# Patient Record
Sex: Male | Born: 1975 | Race: White | Hispanic: No | Marital: Single
Health system: Southern US, Community
[De-identification: ages and names within clinical notes are randomized; demographics above are authoritative.]

---

## 2016-10-29 ENCOUNTER — Emergency Department (HOSPITAL_BASED_OUTPATIENT_CLINIC_OR_DEPARTMENT_OTHER)
Admission: EM | Admit: 2016-10-29 | Discharge: 2016-10-29 | Disposition: A | Discharge status: Home / Self Care | Payer: BLUE CROSS/BLUE SHIELD | Attending: Emergency Medicine | Admitting: Emergency Medicine

## 2016-10-29 ENCOUNTER — Emergency Department (HOSPITAL_BASED_OUTPATIENT_CLINIC_OR_DEPARTMENT_OTHER): Payer: BLUE CROSS/BLUE SHIELD

## 2016-10-29 DIAGNOSIS — K5900 Constipation, unspecified: Secondary | ICD-10-CM | POA: Diagnosis not present

## 2016-10-29 DIAGNOSIS — R1011 Right upper quadrant pain: Secondary | ICD-10-CM | POA: Diagnosis present

## 2016-10-29 DIAGNOSIS — R109 Unspecified abdominal pain: Secondary | ICD-10-CM | POA: Diagnosis not present

## 2016-10-29 LAB — COMPREHENSIVE METABOLIC PANEL
ALBUMIN: 3.8 g/dL (ref 3.5–5.0)
ALK PHOS: 51 U/L (ref 38–126)
ALT: 52 U/L (ref 17–63)
ANION GAP: 7 (ref 5–15)
AST: 27 U/L (ref 15–41)
BILIRUBIN TOTAL: 0.4 mg/dL (ref 0.3–1.2)
BUN: 12 mg/dL (ref 6–20)
CALCIUM: 8.5 mg/dL — AB (ref 8.9–10.3)
CO2: 26 mmol/L (ref 22–32)
Chloride: 105 mmol/L (ref 101–111)
Creatinine, Ser: 1.09 mg/dL (ref 0.61–1.24)
GFR calc Af Amer: >60 mL/min (ref >60)
GLUCOSE: 97 mg/dL (ref 65–99)
Potassium: 4.4 mmol/L (ref 3.5–5.1)
Sodium: 138 mmol/L (ref 135–145)
TOTAL PROTEIN: 7.5 g/dL (ref 6.5–8.1)

## 2016-10-29 LAB — CBC
HEMATOCRIT: 40 % (ref 39.0–52.0)
HEMOGLOBIN: 14.3 g/dL (ref 13.0–17.0)
MCH: 30.3 pg (ref 26.0–34.0)
MCHC: 35.8 g/dL (ref 30.0–36.0)
MCV: 84.7 fL (ref 78.0–100.0)
Platelets: 216 10*3/uL (ref 150–400)
RBC: 4.72 MIL/uL (ref 4.22–5.81)
RDW: 12.6 % (ref 11.5–15.5)
WBC: 7.4 10*3/uL (ref 4.0–10.5)

## 2016-10-29 LAB — URINALYSIS, ROUTINE W REFLEX MICROSCOPIC
GLUCOSE, UA: NEGATIVE mg/dL
Hgb urine dipstick: NEGATIVE
KETONES UR: 15 mg/dL — AB
Leukocytes, UA: NEGATIVE
NITRITE: NEGATIVE
PH: 5.5 (ref 5.0–8.0)
Protein, ur: NEGATIVE mg/dL
Specific Gravity, Urine: 1.028 (ref 1.005–1.030)

## 2016-10-29 LAB — LIPASE, BLOOD: Lipase: 29 U/L (ref 11–51)

## 2016-10-29 MED ORDER — POLYETHYLENE GLYCOL 3350 17 G PO PACK: 17.0000 g | PACK | Freq: Every day | ORAL | 0 refills | Status: AC

## 2016-10-29 MED ORDER — SODIUM CHLORIDE 0.9 % IV BOLUS (SEPSIS)
1000.0000 mL | Freq: Once | INTRAVENOUS | Status: AC
Start: 2016-10-29 — End: 2016-10-29
  Administered 2016-10-29: 1000 mL via INTRAVENOUS

## 2016-10-29 MED ORDER — SODIUM CHLORIDE 0.9 % IV BOLUS (SEPSIS): 1000.0000 mL | Freq: Once | INTRAVENOUS | Status: DC

## 2016-10-29 NOTE — Discharge Instructions (Signed)
Return to the ED with any concerns including vomiting and not able to keep down liquids or your medications, abdominal pain especially if it localizes to the right lower abdomen, fever or chills, and decreased urine output, decreased level of alertness or lethargy, or any other alarming symptoms.  °

## 2016-10-29 NOTE — ED Notes (Signed)
ED Provider at bedside. 

## 2016-10-29 NOTE — ED Notes (Signed)
Patient transported to X-ray 

## 2016-10-29 NOTE — ED Provider Notes (Signed)
[facility]-EMERGENCY DEPT Provider Note   CSN: [id number] Arrival date & time: 10/29/16  1149     History   Chief Complaint Chief Complaint  Patient presents with  . Abdominal Pain    HPI Maurice Alvarez is a 41 y.o. male.  HPI  Pt presenting with c/o abdominal pain.  Today he felt a sharp pain in his upper abdomen that radiated throughout his abdomen and to the left side.  He had recently been having diarrhea over the past several days that was improving- now he is having soft stools- 2 stools today.  No vomiting or fevers.  Had not had any abdominal pain until sudden onset of sharp pain today.  He has not had any treatment prior to arrival.  There are no other associated systemic symptoms, there are no other alleviating or modifying factors.   No past medical history on file.  There are no active problems to display for this patient.   No past surgical history on file.     Home Medications    Prior to Admission medications   Medication Sig Start Date End Date Taking? Authorizing Provider  polyethylene glycol (MIRALAX) packet Take 17 g by mouth daily. 10/29/16   Jerelyn Scott, MD    Family History No family history on file.  Social History Social History  Substance Use Topics  . Smoking status: Not on file  . Smokeless tobacco: Not on file  . Alcohol use Not on file     Allergies   Patient has no allergy information on record.   Review of Systems Review of Systems  ROS reviewed and all otherwise negative except for mentioned in HPI   Physical Exam Updated Vital Signs BP (!) 133/99   Pulse 72   Temp 97.8 F (36.6 C) (Oral)   Resp 18   Ht 5\' 7"  (1.702 m)   Wt 108.9 kg (240 lb)   SpO2 99%   BMI 37.59 kg/m  Vitals reviewed Physical Exam Physical Examination: General appearance - alert, well appearing, and in no distress Mental status - alert, oriented to person, place, and time Eyes - no conjunctival injection no scleral icterus Mouth - mucous  membranes moist, pharynx normal without lesions Neck - supple, no significant adenopathy Chest - clear to auscultation, no wheezes, rales or rhonchi, symmetric air entry Heart - normal rate, regular rhythm, normal S1, S2, no murmurs, rubs, clicks or gallops Abdomen - soft, epigastric and left sided abdominal tenderness-- no gaurding or reboudn tendenress, nondistended, no masses or organomegaly Neurological - alert, oriented, normal speech, no focal findings or movement disorder noted Extremities - peripheral pulses normal, no pedal edema, no clubbing or cyanosis Skin - normal coloration and turgor, no rashes  ED Treatments / Results  Labs (all labs ordered are listed, but only abnormal results are displayed) Labs Reviewed  COMPREHENSIVE METABOLIC PANEL - Abnormal; Notable for the following:       Result Value   Calcium 8.5 (*)    All other components within normal limits  URINALYSIS, ROUTINE W REFLEX MICROSCOPIC - Abnormal; Notable for the following:    Color, Urine AMBER (*)    Bilirubin Urine SMALL (*)    Ketones, ur 15 (*)    All other components within normal limits  CBC  LIPASE, BLOOD    EKG  EKG Interpretation None       Radiology No results found.  Procedures Procedures (including critical care time)  Medications Ordered in ED Medications  sodium chloride 0.9 %  bolus 1,000 mL (0 mLs Intravenous Stopped 10/29/16 1532)     Initial Impression / Assessment and Plan / ED Course  I have reviewed the triage vital signs and the nursing notes.  Pertinent labs & imaging results that were available during my care of the patient were reviewed by me and considered in my medical decision making (see chart for details).     Pt presenting with c/o upper abdominal pain, going to left side.  Labs reassuring, xray reassuring and does show some constipation- no signs of obstruction.  Pt started on miralax.   Discharged with strict return precautions.  Pt agreeable with  plan.  Final Clinical Impressions(s) / ED Diagnoses   Final diagnoses:  Abdominal pain, unspecified abdominal location  Constipation, unspecified constipation type    New Prescriptions Discharge Medication List as of 10/29/2016  3:16 PM    START taking these medications   Details  polyethylene glycol (MIRALAX) packet Take 17 g by mouth daily., Starting Thu 10/29/2016, Print         Jerelyn ScottLinker, Burhan Barham, MD 11/03/16 [phone]

## 2016-10-29 NOTE — ED Triage Notes (Signed)
Painless diarrhea for a week. Today his stool is formed and he is now having abdominal pain. Hx of celiac disease.

## 2019-03-07 IMAGING — DX DG ABDOMEN ACUTE W/ 1V CHEST
4 series · 4 of 4 positions shown · non-contrast
Comparison: None.

CLINICAL DATA: 6 day history of diarrhea and generalized abdominal
pain which began today.

EXAM:
DG ABDOMEN ACUTE W/ 1V CHEST

[chest pa]
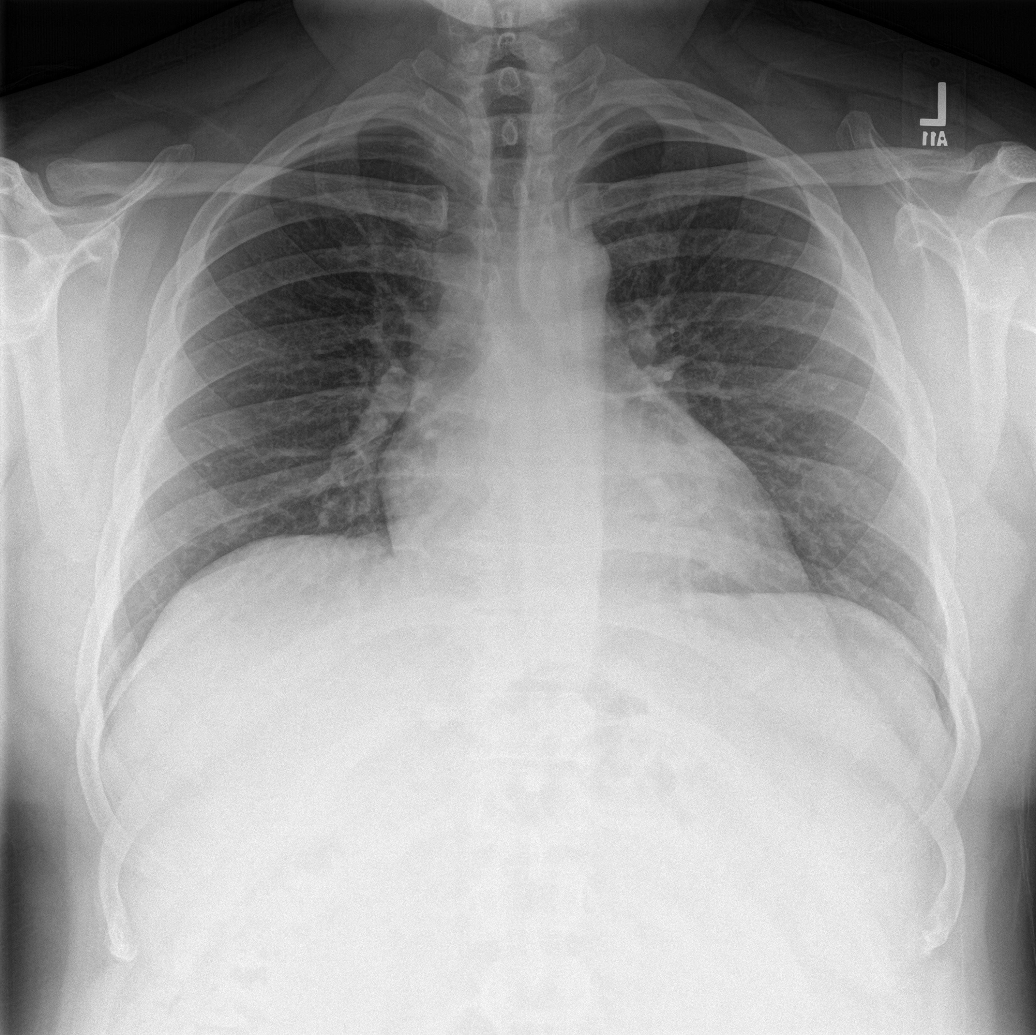

[abdomen erect]
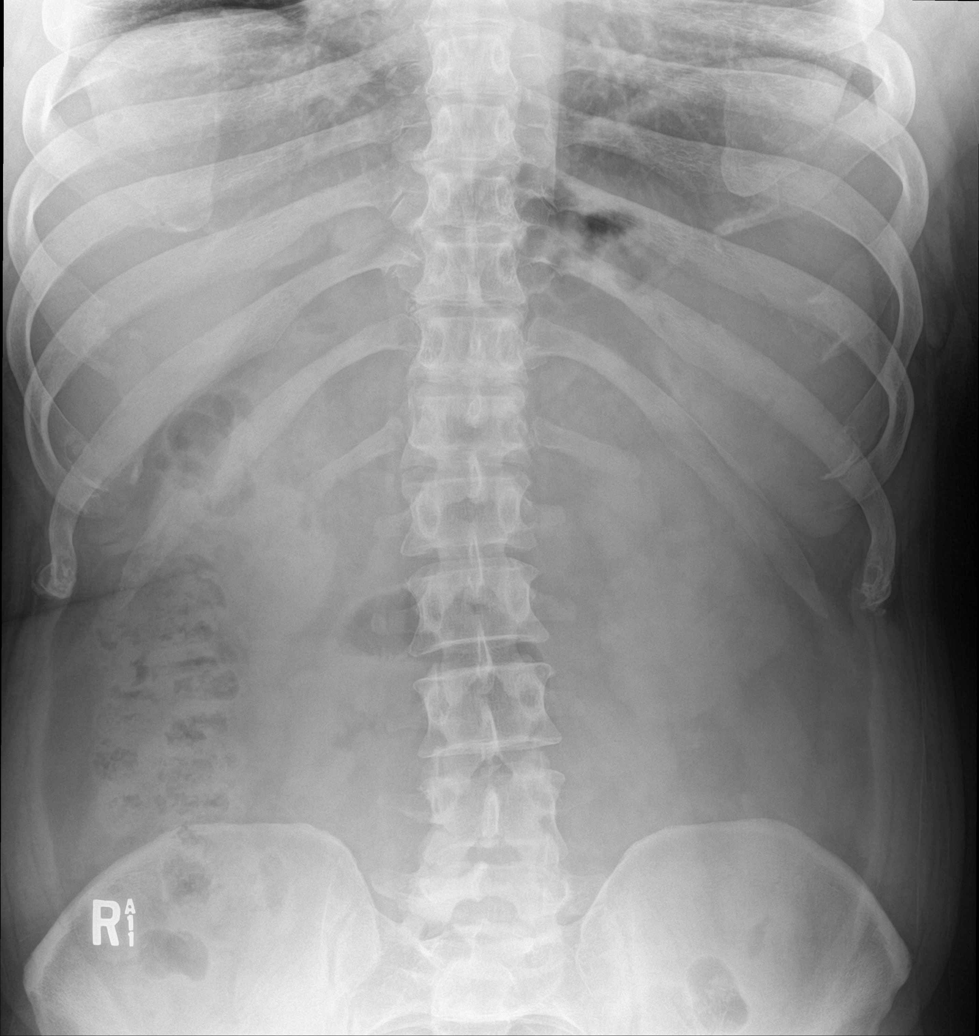

[abdomen supine (1 of 2)]
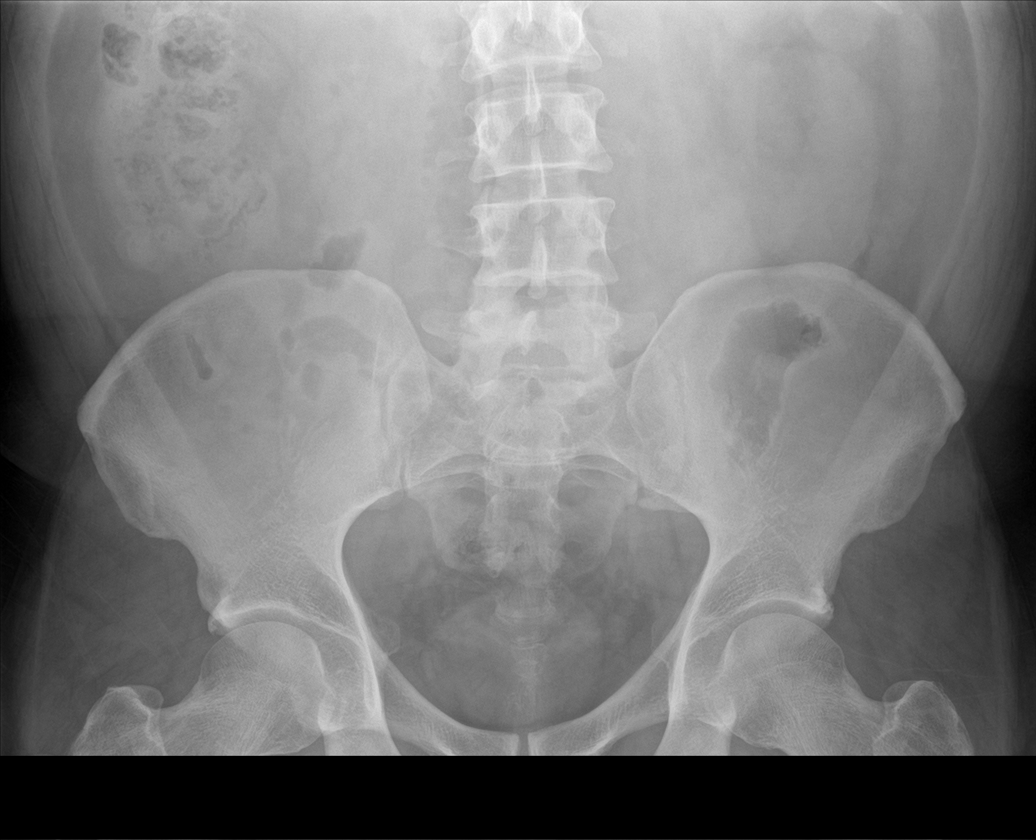

[abdomen supine (2 of 2)]
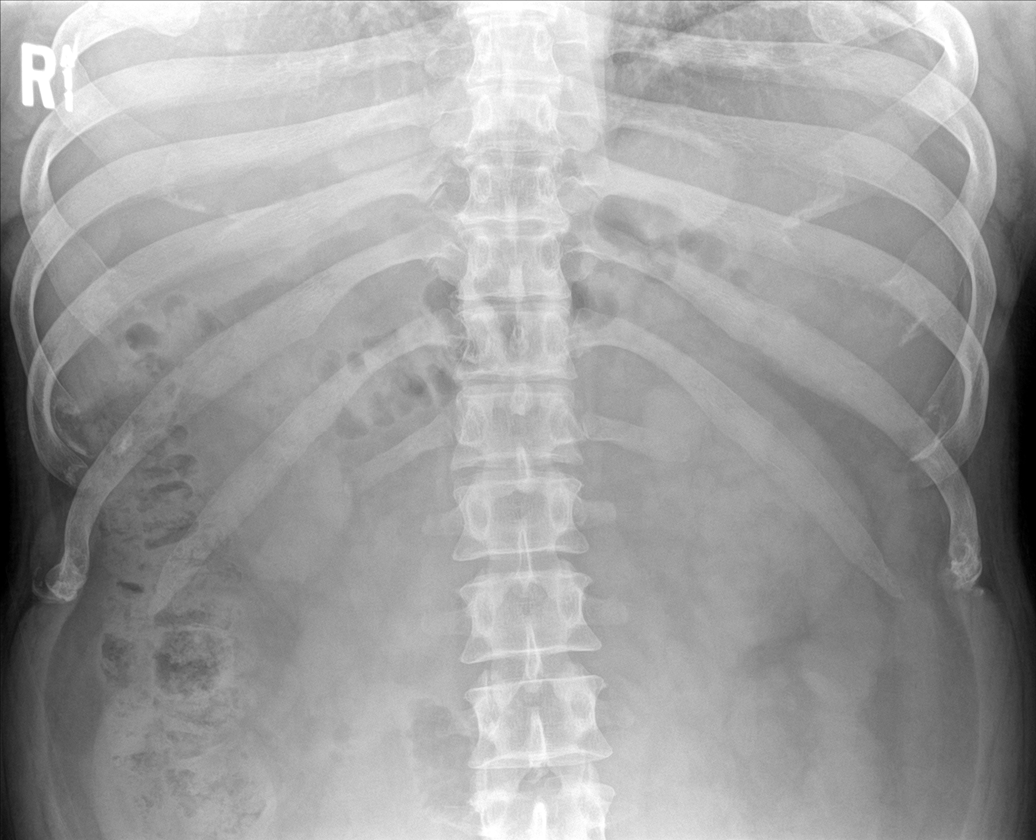

[4 of 4 positions shown; findings below may reference images not displayed]

FINDINGS: Bowel gas pattern unremarkable without evidence of obstruction or
significant ileus. No evidence of free air or significant air-fluid
levels on the erect image. Moderate stool burden in the colon. No
visible opaque urinary tract calculi. Small prostatic
calcifications. Regional skeleton intact.

Suboptimal inspiration accounts for crowded bronchovascular
markings, especially in the bases, and accentuates the cardiac
silhouette. Taking this into account, cardiomediastinal silhouette
unremarkable. Lungs clear. Bronchovascular markings normal.
Pulmonary vascularity normal. No visible pleural effusions. No
pneumothorax.
IMPRESSION: No acute abdominal or pulmonary abnormality.
